# Patient Record
Sex: Female | Born: 1965 | Race: Black or African American | Hispanic: No | Marital: Single | Smoking: Current every day smoker
Health system: Southern US, Community
[De-identification: ages and names within clinical notes are randomized; demographics above are authoritative.]

---

## 2005-02-12 ENCOUNTER — Ambulatory Visit: Payer: Self-pay | Admitting: Internal Medicine

## 2005-04-10 ENCOUNTER — Emergency Department (HOSPITAL_COMMUNITY): Admission: EM | Admit: 2005-04-10 | Discharge: 2005-04-10 | Payer: Self-pay | Admitting: Emergency Medicine

## 2005-04-21 ENCOUNTER — Ambulatory Visit: Payer: Self-pay | Admitting: Internal Medicine

## 2005-06-02 ENCOUNTER — Emergency Department (HOSPITAL_COMMUNITY): Admission: EM | Admit: 2005-06-02 | Discharge: 2005-06-02 | Payer: Self-pay | Admitting: Emergency Medicine

## 2005-07-08 ENCOUNTER — Emergency Department (HOSPITAL_COMMUNITY): Admission: EM | Admit: 2005-07-08 | Discharge: 2005-07-08 | Payer: Self-pay | Admitting: Emergency Medicine

## 2005-07-09 ENCOUNTER — Emergency Department (HOSPITAL_COMMUNITY): Admission: EM | Admit: 2005-07-09 | Discharge: 2005-07-09 | Payer: Self-pay | Admitting: Emergency Medicine

## 2005-07-15 ENCOUNTER — Ambulatory Visit (HOSPITAL_COMMUNITY): Admission: RE | Admit: 2005-07-15 | Discharge: 2005-07-15 | Payer: Self-pay | Admitting: Internal Medicine

## 2005-08-15 ENCOUNTER — Emergency Department (HOSPITAL_COMMUNITY): Admission: EM | Admit: 2005-08-15 | Discharge: 2005-08-15 | Payer: Self-pay | Admitting: Emergency Medicine

## 2005-09-16 ENCOUNTER — Emergency Department (HOSPITAL_COMMUNITY): Admission: EM | Admit: 2005-09-16 | Discharge: 2005-09-16 | Payer: Self-pay | Admitting: Emergency Medicine

## 2005-09-18 ENCOUNTER — Emergency Department (HOSPITAL_COMMUNITY): Admission: EM | Admit: 2005-09-18 | Discharge: 2005-09-18 | Payer: Self-pay | Admitting: Emergency Medicine

## 2005-09-24 ENCOUNTER — Emergency Department (HOSPITAL_COMMUNITY): Admission: EM | Admit: 2005-09-24 | Discharge: 2005-09-24 | Payer: Self-pay | Admitting: Emergency Medicine

## 2005-10-02 ENCOUNTER — Emergency Department (HOSPITAL_COMMUNITY): Admission: EM | Admit: 2005-10-02 | Discharge: 2005-10-03 | Payer: Self-pay | Admitting: Emergency Medicine

## 2005-11-07 ENCOUNTER — Emergency Department (HOSPITAL_COMMUNITY): Admission: EM | Admit: 2005-11-07 | Discharge: 2005-11-07 | Payer: Self-pay | Admitting: Emergency Medicine

## 2006-02-27 ENCOUNTER — Emergency Department (HOSPITAL_COMMUNITY): Admission: EM | Admit: 2006-02-27 | Discharge: 2006-02-27 | Payer: Self-pay | Admitting: Emergency Medicine

## 2006-03-06 ENCOUNTER — Emergency Department (HOSPITAL_COMMUNITY): Admission: EM | Admit: 2006-03-06 | Discharge: 2006-03-06 | Payer: Self-pay | Admitting: Emergency Medicine

## 2007-07-22 IMAGING — CR DG KNEE 1-2V*L*
3 series · 3 of 3 positions shown · non-contrast
Comparison: none

CLINICAL DATA: Prior injury in 7658.  Left knee pain for one month. 
 LEFT KNEE - 2 VIEW:
 No prior studies. 
 No visible fracture or definite knee effusion.  No acute bony findings.

[t knee lat left (1 of 2)]
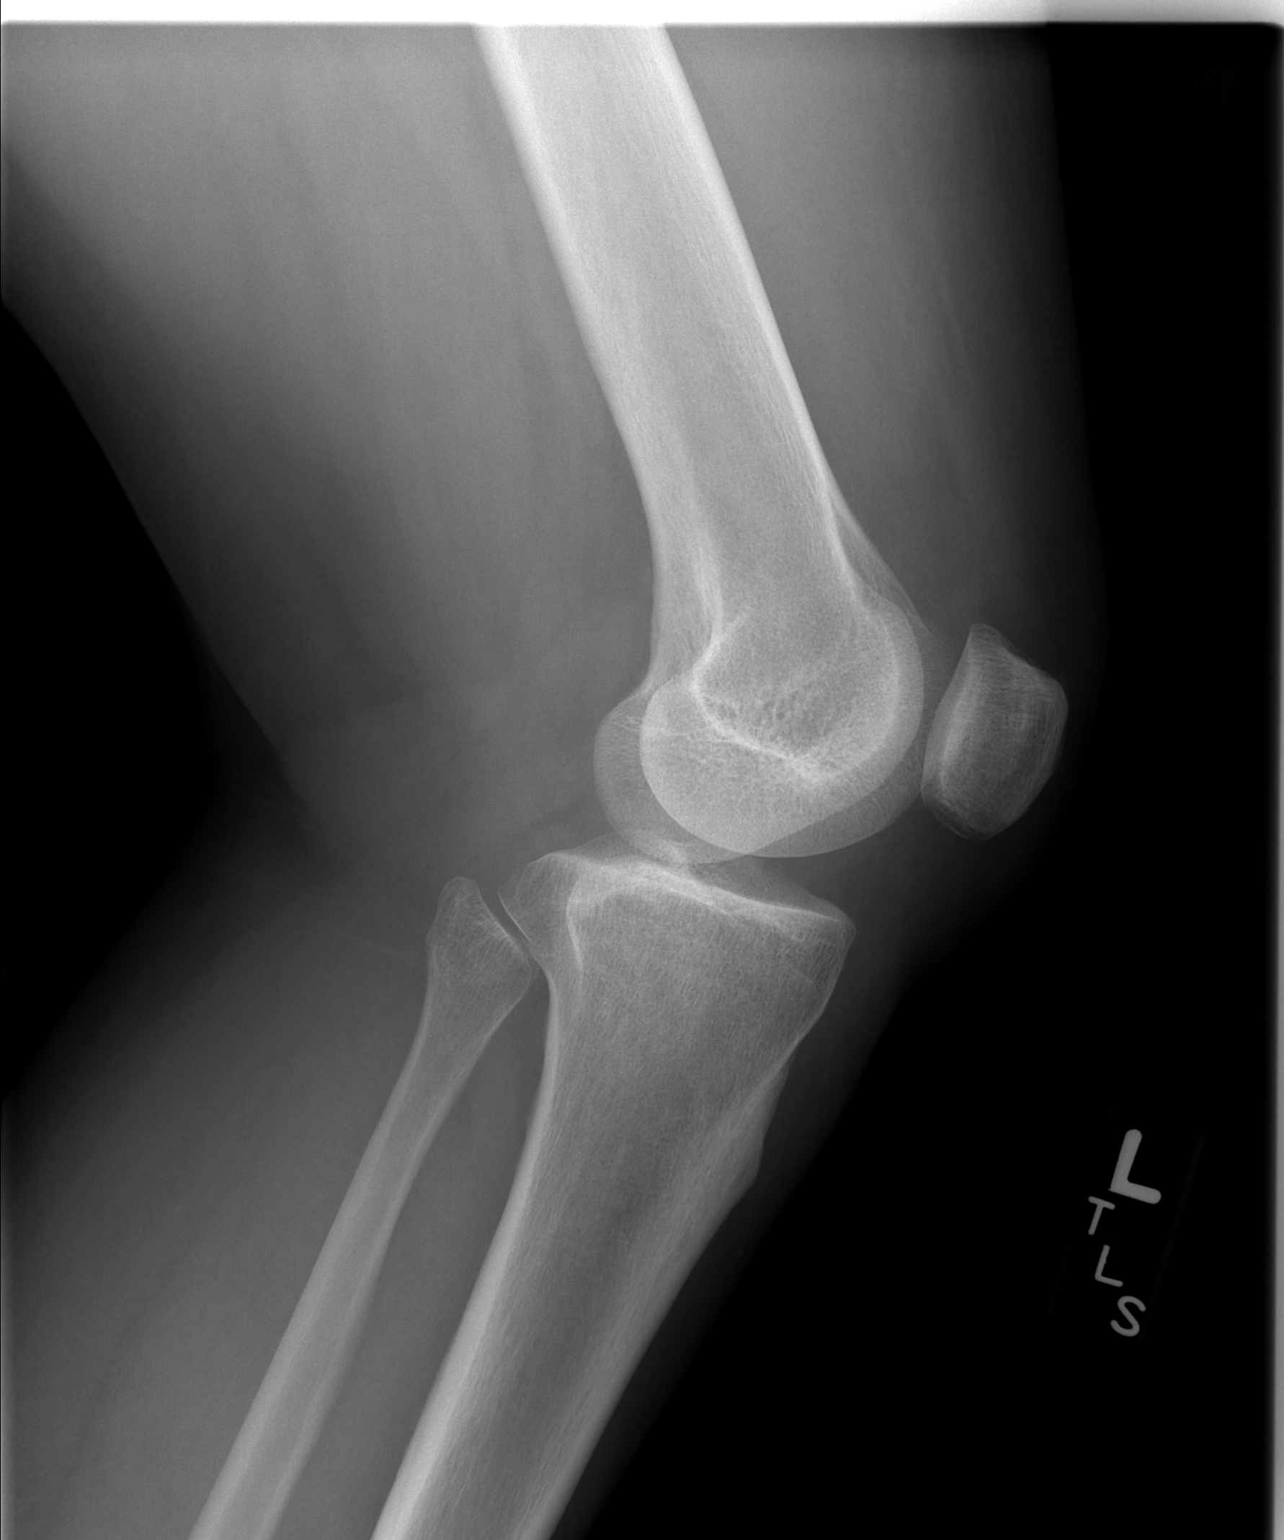

[t knee lat left (2 of 2)]
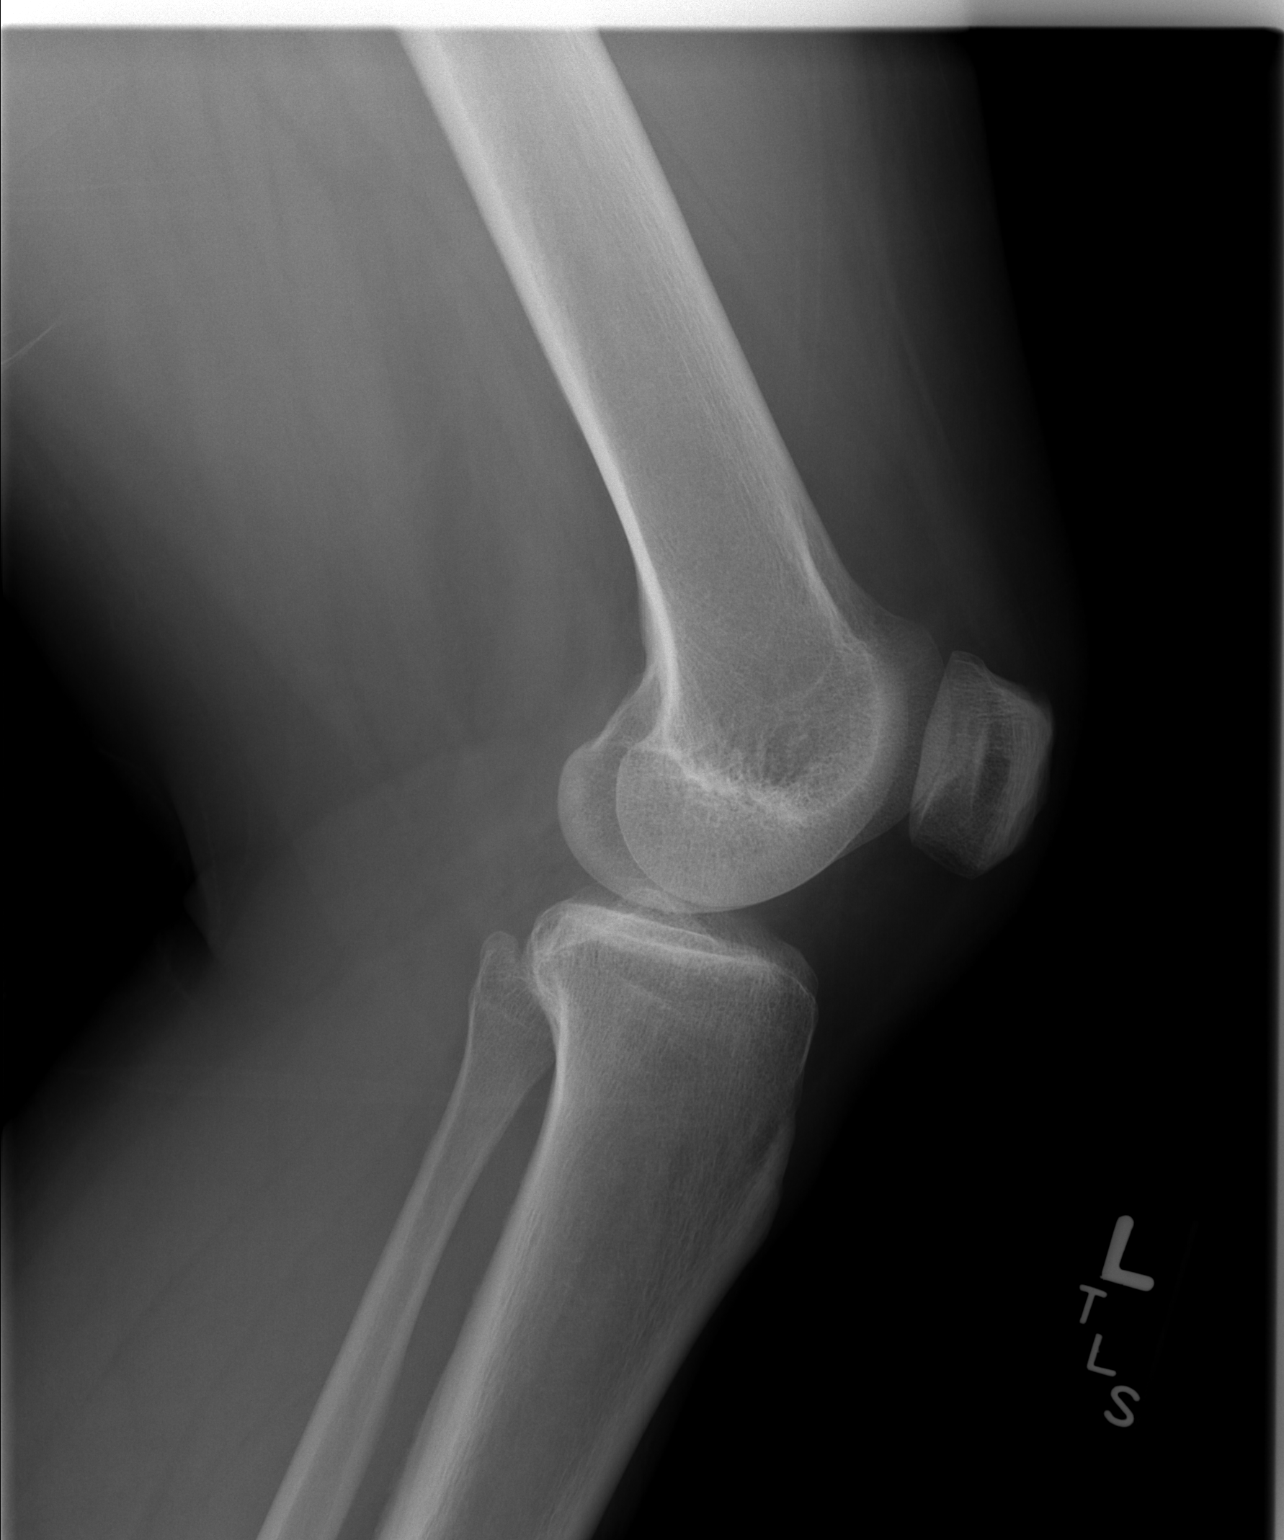

[t knee ap left]
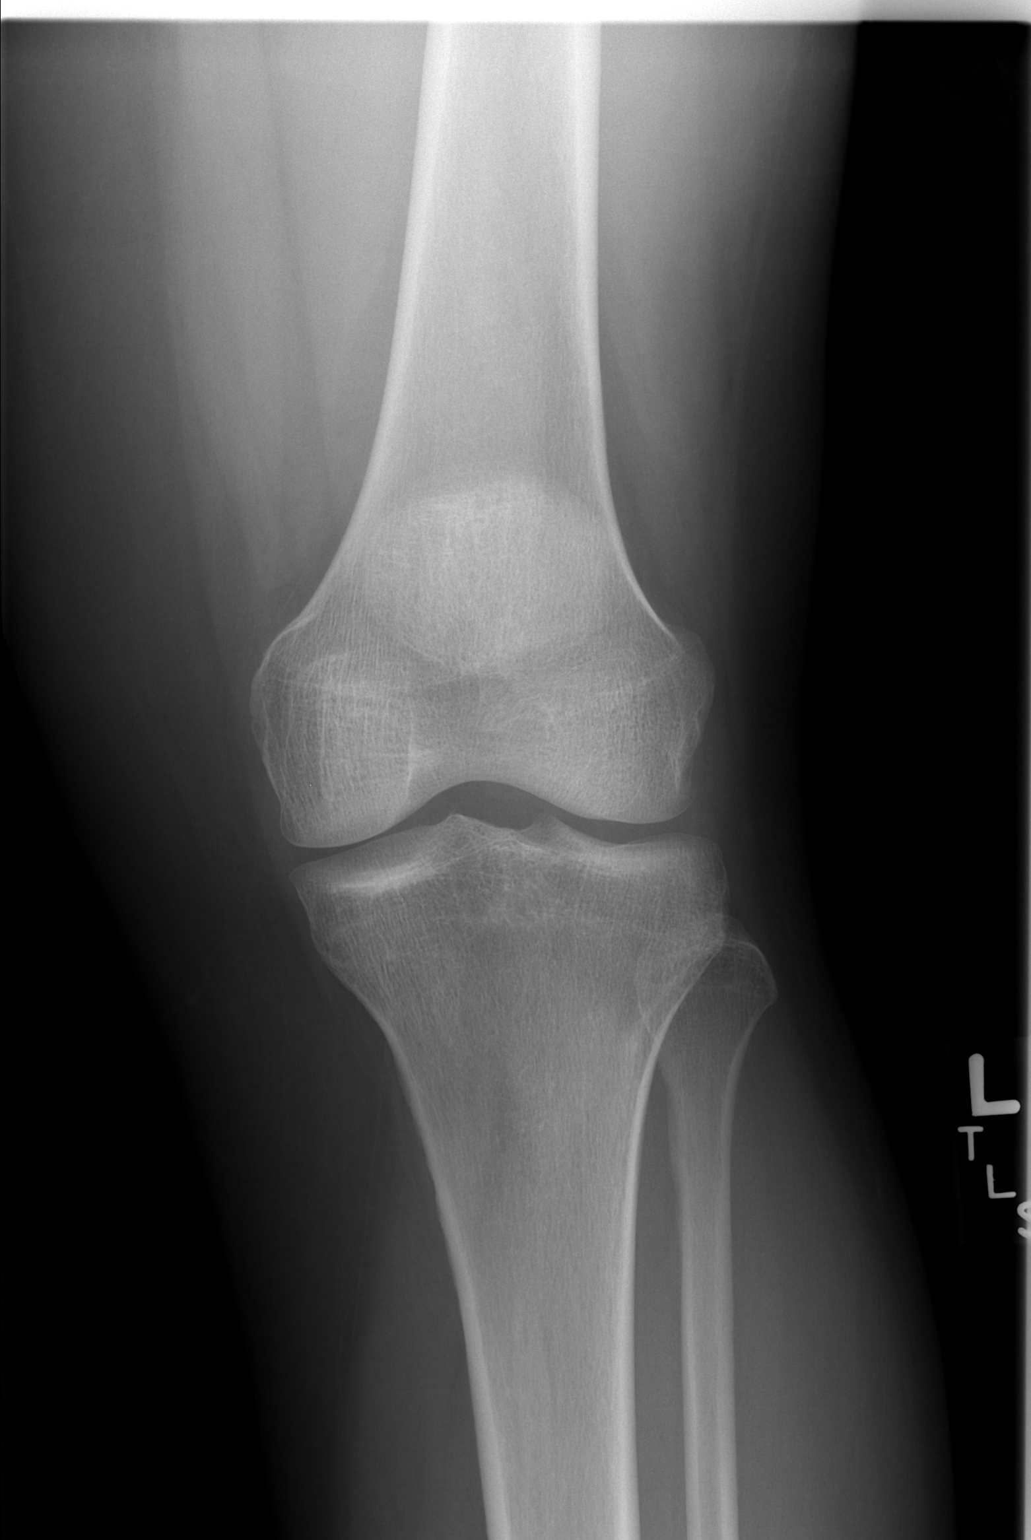

[3 of 3 positions shown; findings below may reference images not displayed]

IMPRESSION: No acute bony findings.  If pain persists, then MRI may be helpful for further characterization.

## 2008-03-05 IMAGING — CR DG CHEST 2V
2 series · 2 of 2 positions shown · non-contrast
Comparison: 11/07/05 and 09/16/05.

CLINICAL DATA: Swollen legs.  Joint pain.  Short of breath.
 CHEST - 2 VIEW:

[w chest pa]
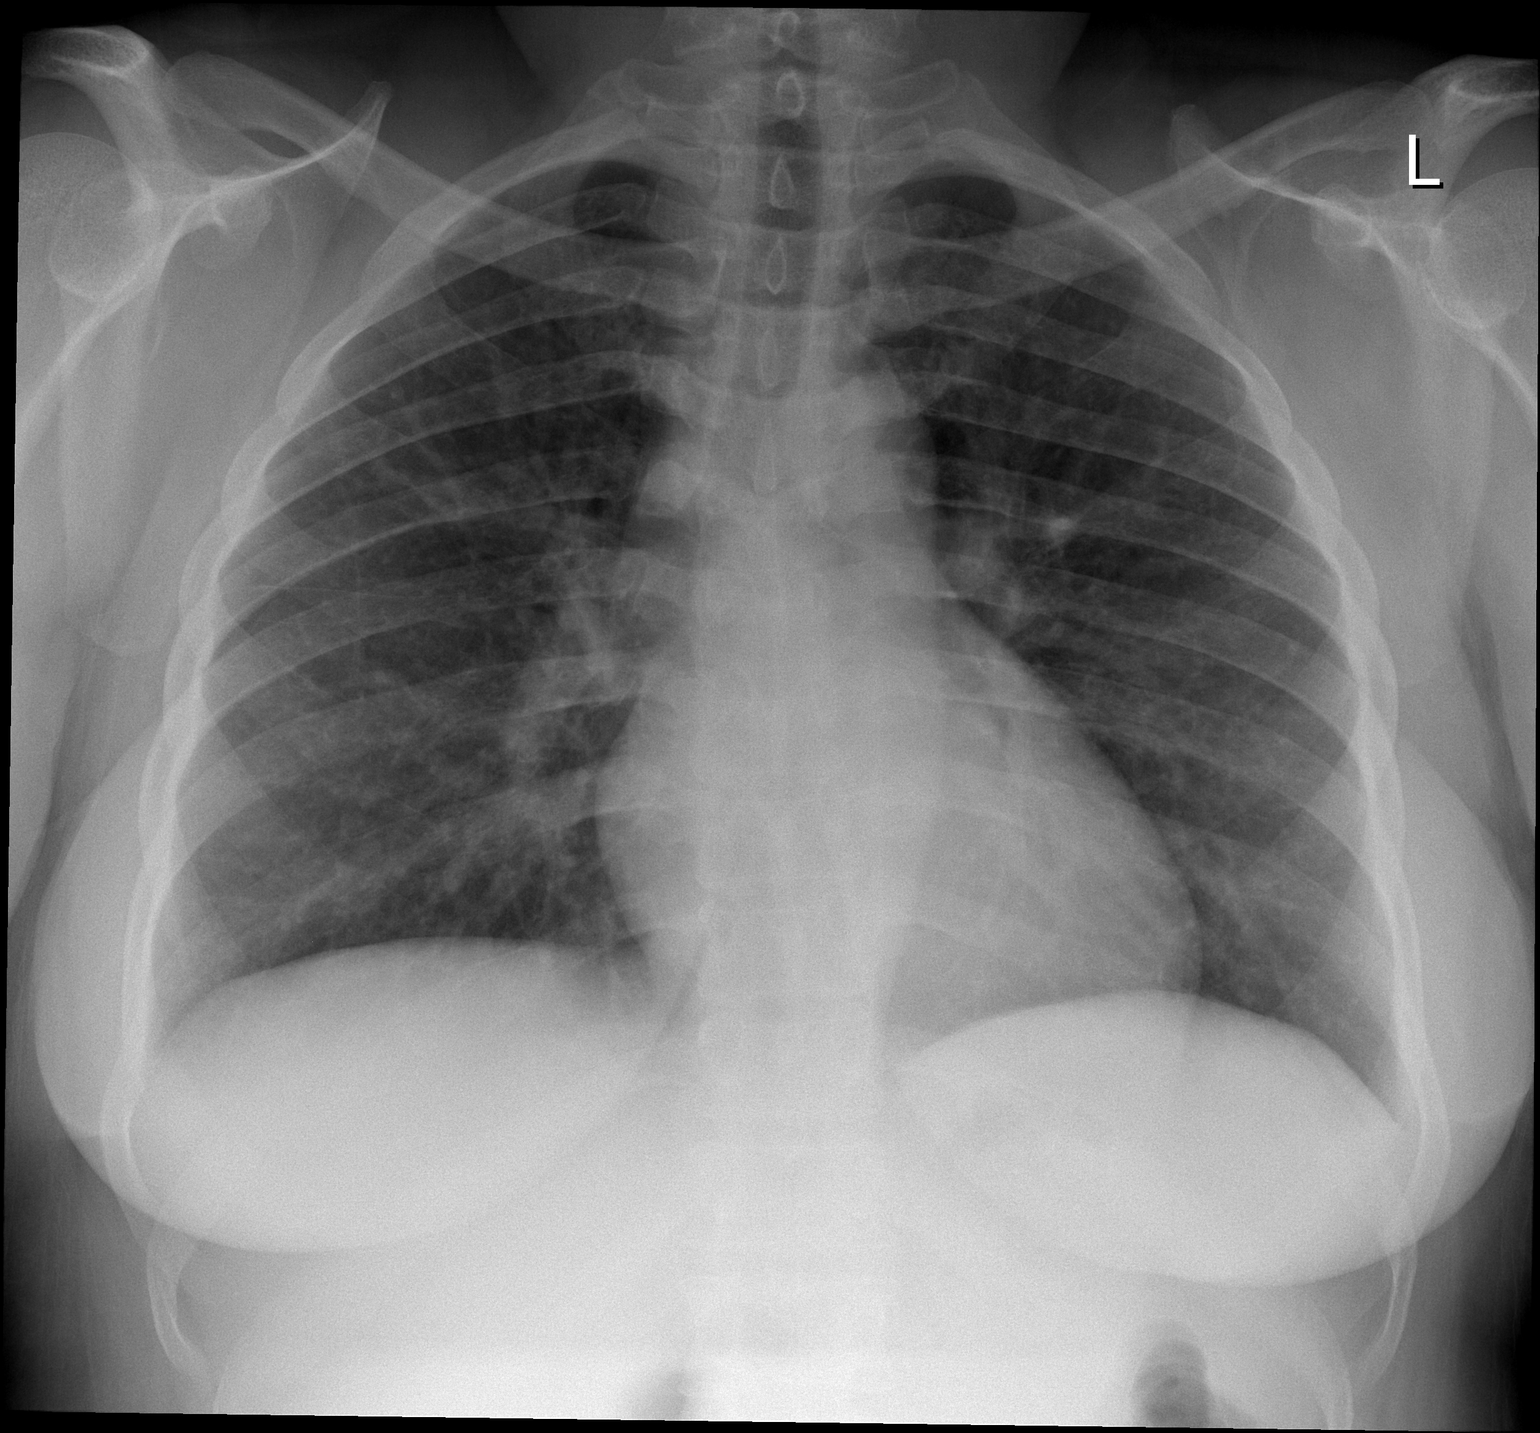

[w chest lat *]
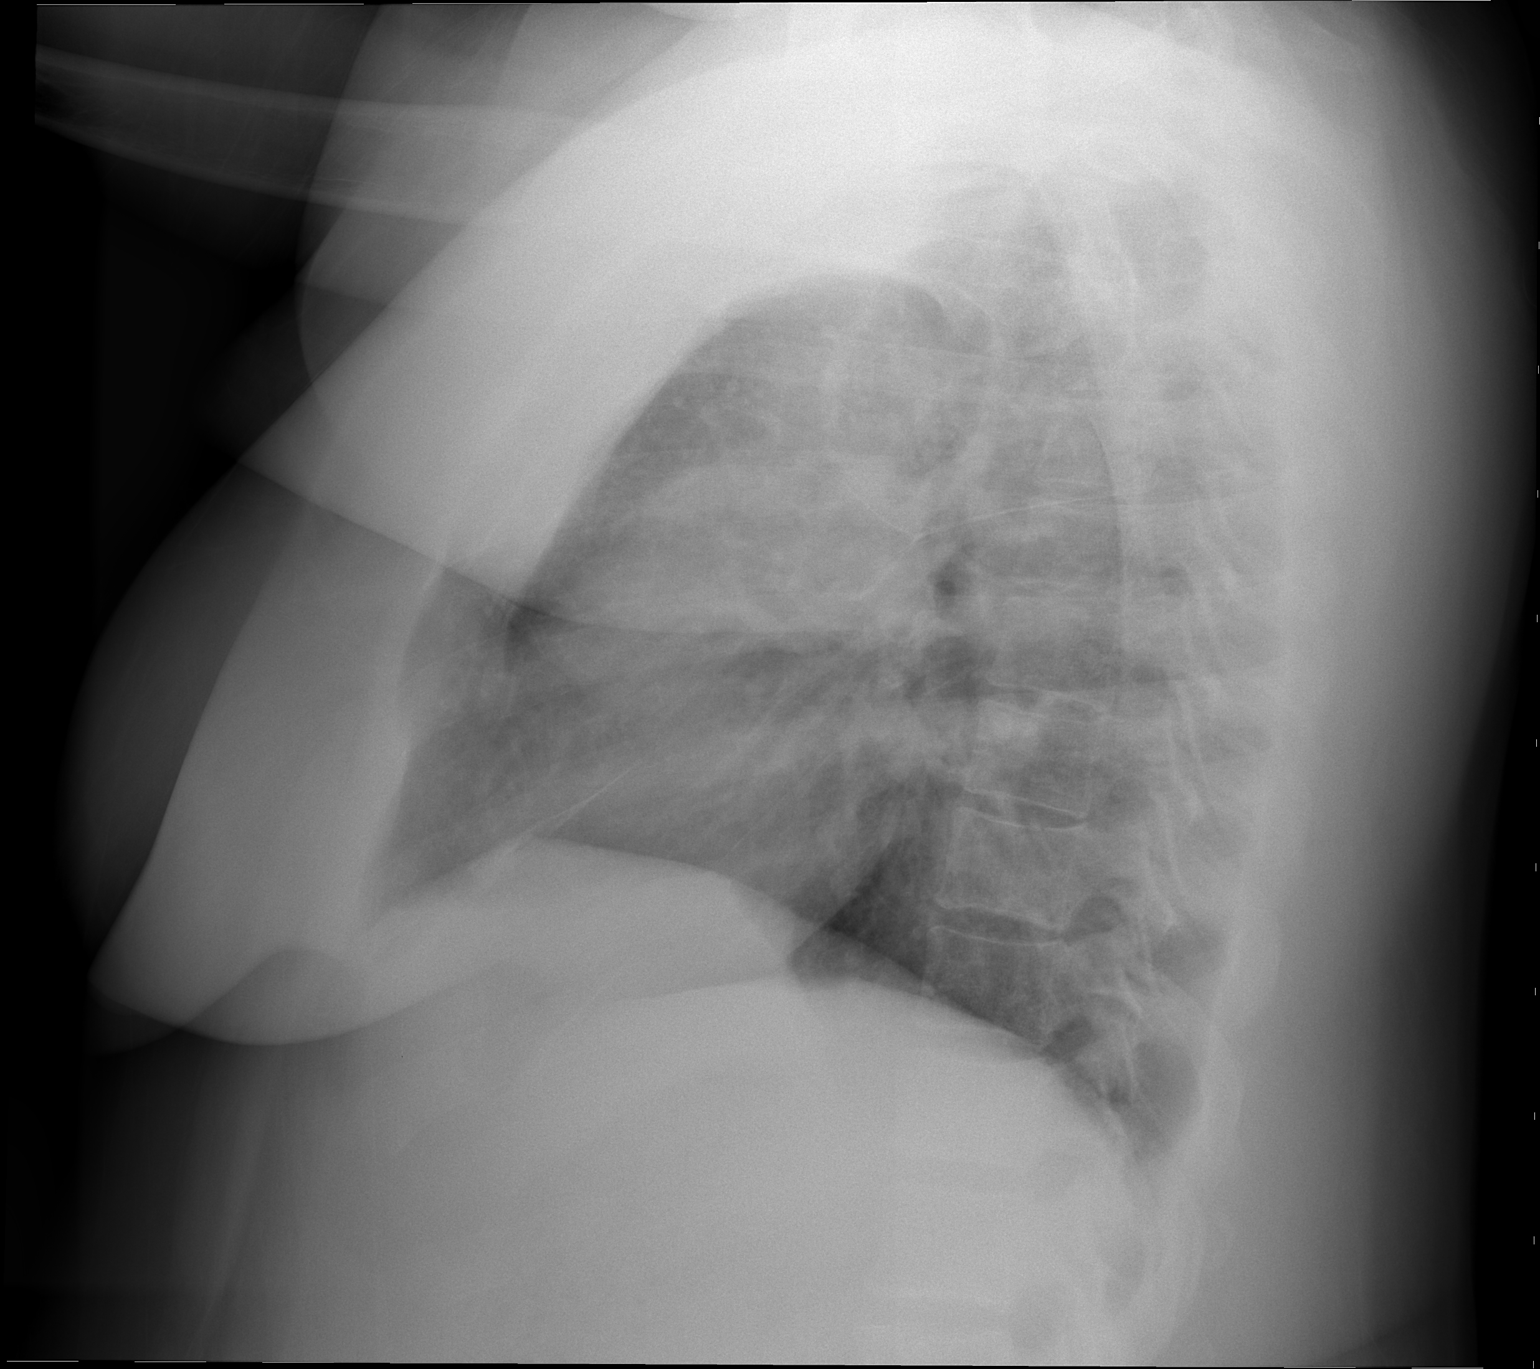

[2 of 2 positions shown; findings below may reference images not displayed]

FINDINGS: There appears to be pulmonary venous hypertension, possibly with early interstitial edema.  Alternatively, the findings could be due to mild interstitial pneumonitis.  No effusions.  Bony structures unremarkable.
IMPRESSION: More prominent interstitial lung markings, which could reflect edema or pneumonitis.

## 2010-11-15 ENCOUNTER — Encounter: Payer: Self-pay | Admitting: Orthopedic Surgery

## 2015-07-22 ENCOUNTER — Emergency Department (HOSPITAL_COMMUNITY)
Admission: EM | Admit: 2015-07-22 | Discharge: 2015-07-22 | Discharge status: Against Medical Advice | Payer: Self-pay | Attending: Emergency Medicine | Admitting: Emergency Medicine

## 2015-07-22 ENCOUNTER — Encounter (HOSPITAL_COMMUNITY): Payer: Self-pay

## 2015-07-22 DIAGNOSIS — R110 Nausea: Secondary | ICD-10-CM | POA: Insufficient documentation

## 2015-07-22 DIAGNOSIS — Z720 Tobacco use: Secondary | ICD-10-CM | POA: Insufficient documentation

## 2015-07-22 DIAGNOSIS — R109 Unspecified abdominal pain: Secondary | ICD-10-CM | POA: Insufficient documentation

## 2015-07-22 NOTE — ED Notes (Signed)
Per [facility]: Pt complaining of abdominal for 4 weeks, 6/10 pain, no change in pain with palpation or movement. Pt does have nausea, no vomiting or diarrhea.

## 2015-07-22 NOTE — ED Notes (Signed)
Pt not in room. Belongings not in room. Security is aware and looking for the pt,

## 2015-07-22 NOTE — ED Notes (Signed)
Dr. Patria Mane at bedside. Pt is stating that she wants to leave. Dr. Patria Mane requests security to escort pt out

## 2015-07-22 NOTE — ED Provider Notes (Signed)
CSN: [id number]     Arrival date & time 07/22/15  2258 History  This chart was scribed for Azalia Bilis, MD by Phillis Haggis, ED Scribe. This patient was seen in room B18C/B18C and patient care was started at 11:17 PM.   Chief Complaint  Patient presents with  . Abdominal Pain   The history is provided by the patient. No language interpreter was used.   level V caveat: Uncooperative  HPI Comments: Whitney Washington is a 49 y.o. female who presents to the Emergency Department complaining of abdominal pain onset 4 weeks ago. Pt states that the pain worsens with eating. Pt states that she is "sick" in her stomach. "I know something is wrong with my stomach."   History reviewed. No pertinent past medical history. History reviewed. No pertinent past surgical history. No family history on file. Social History  Substance Use Topics  . Smoking status: Current Every Day Smoker -- 1.00 packs/day    Types: Cigarettes  . Smokeless tobacco: Never Used  . Alcohol Use: No   OB History    No data available     Review of Systems  Unable to perform ROS Gastrointestinal: Positive for nausea and abdominal pain.   Allergies  Review of patient's allergies indicates no known allergies.  Home Medications   Prior to Admission medications   Not on File   BP 128/85 mmHg  Pulse 63  Temp(Src) 98.2 F (36.8 C) (Oral)  Resp 17  SpO2 95%  Physical Exam  Constitutional: She is oriented to person, place, and time. She appears well-developed and well-nourished.  HENT:  Head: Normocephalic.  Eyes: EOM are normal.  Neck: Normal range of motion.  Cardiovascular: Normal rate.   Pulmonary/Chest: Effort normal.  Abdominal: She exhibits no distension. There is no tenderness. There is no rebound and no guarding.  Musculoskeletal: Normal range of motion.  Neurological: She is alert and oriented to person, place, and time.  Psychiatric: She has a normal mood and affect.  Nursing note and vitals  reviewed.   ED Course  Procedures (including critical care time) DIAGNOSTIC STUDIES: Oxygen Saturation is 95% on RA, adequate by my interpretation.    COORDINATION OF CARE: 11:20 PM-Pt states that she wants to leave  Labs Review Labs Reviewed  LIPASE, BLOOD  COMPREHENSIVE METABOLIC PANEL  CBC  URINALYSIS, ROUTINE W REFLEX MICROSCOPIC (NOT AT [facility])  I-STAT BETA HCG BLOOD, ED ([facility], [facility], [facility] ONLY)    Imaging Review No results found.   EKG Interpretation None      MDM   Final diagnoses:  None    Patient begin promptly agitated while in the emergency department.  She is frustrated by my line of questioning which was standard questioning for patient with abdominal pain in regards to her symptoms.  She took off her monitors put her clothes on and went off the ambulance bay.  I had no opportunity to discuss AGAINST MEDICAL ADVICE instructions with her.  I do not think she is a threat to herself or others at this time.  Hopefully she will return at some point for [place] to evaluate this abdominal pain further.  No indication for involuntary commitment.  She did not have an IV in place  I personally performed the services described in this documentation, which was scribed in my presence. The recorded information has been reviewed and is accurate.      Azalia Bilis, MD 07/23/15 [id number]

## 2015-07-22 NOTE — ED Notes (Signed)
Pt heard muttering under her breath, will not repeat to RN what she is saying.

## 2015-07-22 NOTE — ED Notes (Signed)
Pt states "Why do you have to ask me psych questions if I am here for medical?" This RN explained the reasoning.

## 2015-07-22 NOTE — ED Notes (Signed)
Dr. Patria Mane scribe believes she saw the pt running out of the ambulance bay.

## 2015-07-22 NOTE — ED Notes (Signed)
Pt refusing IV start and blood draw. Pt screamed at this RN "I don't want an IV!", this RN states that the pt does not have to have an IV, this RN asked the pt if she will allow her blood to be drawn to which the pt states "I DONT WANT MY BLOOD DRAWN!". This RN states that the pt has a right to refuse and asks the pt not to yell at this RN and that the behavior will not be tolerated.
